# Patient Record
Sex: Female | Born: 1935 | Race: White | Hispanic: No
Health system: Southern US, Community
[De-identification: ages and names within clinical notes are randomized; demographics above are authoritative.]

## PROBLEM LIST (undated history)

## (undated) DIAGNOSIS — F0390 Unspecified dementia without behavioral disturbance: Secondary | ICD-10-CM

## (undated) DIAGNOSIS — I679 Cerebrovascular disease, unspecified: Secondary | ICD-10-CM

## (undated) DIAGNOSIS — Z8679 Personal history of other diseases of the circulatory system: Secondary | ICD-10-CM

## (undated) DIAGNOSIS — I10 Essential (primary) hypertension: Secondary | ICD-10-CM

## (undated) DIAGNOSIS — J449 Chronic obstructive pulmonary disease, unspecified: Secondary | ICD-10-CM

## (undated) DIAGNOSIS — E119 Type 2 diabetes mellitus without complications: Secondary | ICD-10-CM

## (undated) DIAGNOSIS — Z9889 Other specified postprocedural states: Secondary | ICD-10-CM

## (undated) HISTORY — DX: Type 2 diabetes mellitus without complications: E11.9

## (undated) HISTORY — DX: Unspecified dementia, unspecified severity, without behavioral disturbance, psychotic disturbance, mood disturbance, and anxiety: F03.90

## (undated) HISTORY — PX: THYROIDECTOMY: SHX17

## (undated) HISTORY — DX: Cerebrovascular disease, unspecified: I67.9

## (undated) HISTORY — DX: Chronic obstructive pulmonary disease, unspecified: J44.9

## (undated) HISTORY — PX: MASTECTOMY: SHX3

## (undated) HISTORY — DX: Other specified postprocedural states: Z98.890

## (undated) HISTORY — DX: Personal history of other diseases of the circulatory system: Z86.79

## (undated) HISTORY — DX: Essential (primary) hypertension: I10

---

## 2007-07-09 ENCOUNTER — Inpatient Hospital Stay (HOSPITAL_COMMUNITY): Admission: EM | Admit: 2007-07-09 | Discharge: 2007-07-15 | Payer: Self-pay | Admitting: Emergency Medicine

## 2007-07-09 ENCOUNTER — Ambulatory Visit: Payer: Self-pay | Admitting: Cardiovascular Disease

## 2007-08-15 ENCOUNTER — Emergency Department (HOSPITAL_COMMUNITY): Admission: EM | Admit: 2007-08-15 | Discharge: 2007-08-15 | Payer: Self-pay | Admitting: Emergency Medicine

## 2008-01-14 IMAGING — CR DG ABD PORTABLE 1V
1 series · 1 of 1 positions shown · non-contrast
Comparison: None

CLINICAL DATA: Chest pain

PORTABLE ABDOMEN - 1 VIEW

[view not recorded]
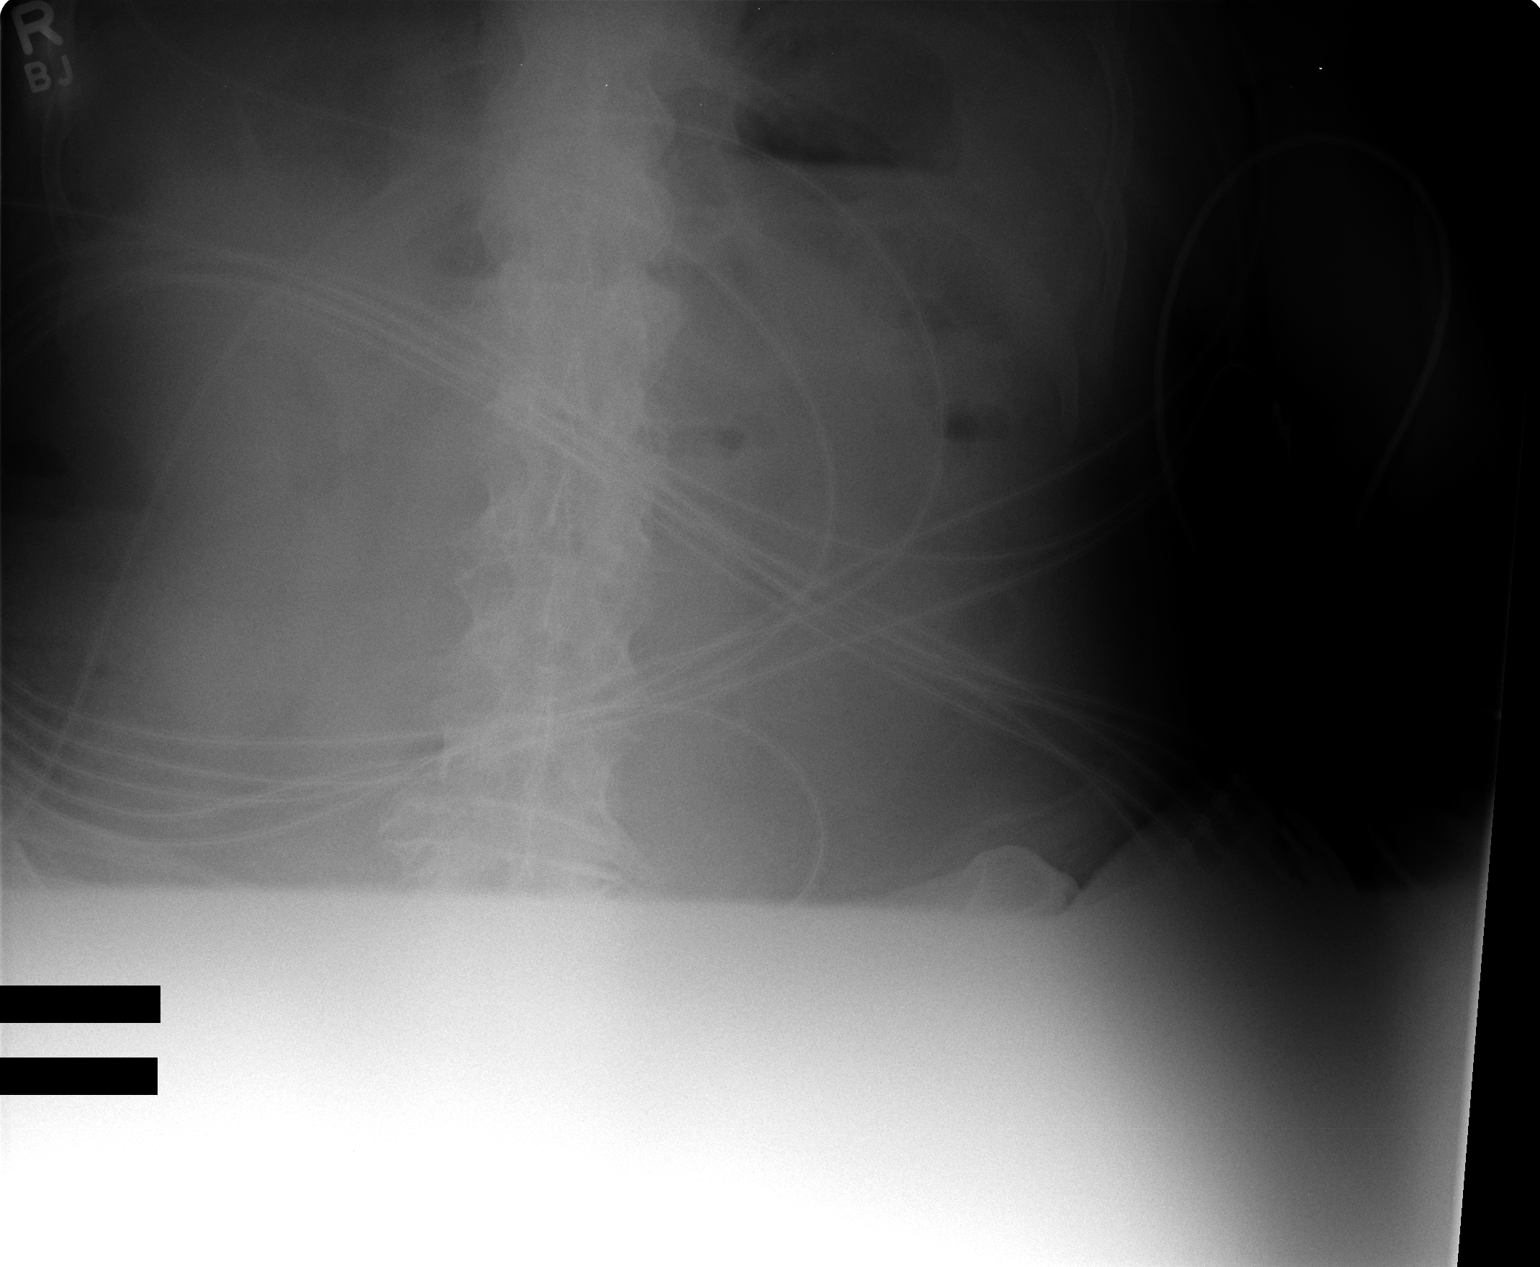

[1 of 1 positions shown; findings below may reference images not displayed]

FINDINGS: Study is severely limited by patient's condition and portable nature
the study. No dilated bowel to suggest obstruction. No supine evidence of free
air.

IMPRESSION

Severely limited study. No obstruction.

## 2008-01-18 ENCOUNTER — Emergency Department (HOSPITAL_COMMUNITY): Admission: EM | Admit: 2008-01-18 | Discharge: 2008-01-18 | Payer: Self-pay | Admitting: Emergency Medicine

## 2008-09-22 ENCOUNTER — Ambulatory Visit (HOSPITAL_COMMUNITY): Admission: RE | Admit: 2008-09-22 | Discharge: 2008-09-22 | Payer: Self-pay | Admitting: Internal Medicine

## 2009-10-03 ENCOUNTER — Ambulatory Visit (HOSPITAL_COMMUNITY): Admission: RE | Admit: 2009-10-03 | Discharge: 2009-10-03 | Payer: Self-pay | Admitting: Internal Medicine

## 2010-03-22 DIAGNOSIS — F068 Other specified mental disorders due to known physiological condition: Secondary | ICD-10-CM | POA: Insufficient documentation

## 2010-03-24 ENCOUNTER — Ambulatory Visit: Payer: Self-pay | Admitting: Cardiology

## 2010-03-24 DIAGNOSIS — I495 Sick sinus syndrome: Secondary | ICD-10-CM

## 2010-03-24 DIAGNOSIS — I10 Essential (primary) hypertension: Secondary | ICD-10-CM

## 2010-03-27 ENCOUNTER — Encounter: Payer: Self-pay | Admitting: Cardiology

## 2010-09-28 ENCOUNTER — Ambulatory Visit: Payer: Self-pay | Admitting: Cardiology

## 2010-09-29 ENCOUNTER — Encounter: Payer: Self-pay | Admitting: Cardiology

## 2010-10-09 ENCOUNTER — Ambulatory Visit (HOSPITAL_COMMUNITY): Admission: RE | Admit: 2010-10-09 | Payer: Self-pay | Admitting: Internal Medicine

## 2010-12-09 ENCOUNTER — Encounter: Payer: Self-pay | Admitting: Internal Medicine

## 2010-12-19 NOTE — Assessment & Plan Note (Signed)
Summary: **PER NP FOR [facility]/TAMMY LEWIS FOR BRADYCARDIA/TG   Visit Type:  Initial Consult Primary Provider:  Memory Dance, NP   History of Present Illness: 77 year old woman referred for cardiology consultation. She is a pleasant resident of [facility] nursing home here in [place] with past medical history outlined below. She would appear to have fairly significant dementia based on her history providing. She has a DNR status.  Mr. Ruder is referred with findings of recent bradycardia, that would appear to be fairly asymptomatic. Recent records indicate evaluation in April with findings of heart rates in the 40s to 50s, and a report of an electrocardiogram suggesting atrial fibrillation with nonspecific ST-T changes and possible old anterior infarct pattern. I do not have the actual tracing for review. Recent TSH in April was normal at 2.9. Labs from February revealed potassium 4.5, BUN 24, creatinine 1.0.  The patient states that she ambulates some and also uses a wheelchair. She denies any significant exertional chest pain or unusual breathlessness. She also denies dizziness and frank syncope.  I reviewed her medications which are outlined below.  Followup electrocardiogram is described below.  Current Medications (verified): 1)  Vit D .... 50,000 Units Mthly 2)  Allopurinol 100 Mg Tabs (Allopurinol) .... Take 1 Tab Daily 3)  Aspir-Low 81 Mg Tbec (Aspirin) .... Take 1 Tab Daily 4)  Certavite/antioxidants  Tabs (Multiple Vitamins-Minerals) .... Take 1 Tab Daily 5)  Aricept 10 Mg Tabs (Donepezil Hcl) .... Take 1 Tab Daily 6)  Furosemide 20 Mg Tabs (Furosemide) .... Take 1 Tab Daily 7)  Potassium Chloride 20 Meq Pack (Potassium Chloride) .... Take 1 Tab Daily 8)  Lisinopril 40 Mg Tabs (Lisinopril) .... Take 1 Tab Daily 9)  Oxybutynin Chloride 10 Mg Xr24h-Tab (Oxybutynin Chloride) .... Take 1 Tab Daily 10)  Spiriva Handihaler 18 Mcg Caps (Tiotropium Bromide Monohydrate) .... Use 1  Treatment Two Times A Day 11)  Daily Multi  Tabs (Multiple Vitamins-Minerals) .... Take 1 Tab Daily 12)  Clonidine Hcl 0.1 Mg Tabs (Clonidine Hcl) .... Take 1 Tab Two Times A Day 13)  Ranitidine Hcl 150 Mg Caps (Ranitidine Hcl) .... Take 1 Tab Two Times A Day 14)  Oscal 500/200 D-3 500-200 Mg-Unit Tabs (Calcium-Vitamin D) .... Take 1 Tab Daily 15)  Simvastatin 20 Mg Tabs (Simvastatin) .... Take 1 Tab At Bedtime 16)  Imodium A-D 2 Mg Tabs (Loperamide Hcl) .... Take As Needed 17)  Nitrostat 0.4 Mg Subl (Nitroglycerin) .... Use As Directed For Chest Pain 18)  Senokot S 8.6-50 Mg Tabs (Sennosides-Docusate Sodium) .... Take As Needed 19)  Benadryl 25 Mg Caps (Diphenhydramine Hcl) .... Take As Needed 20)  Q-Tussin 100 Mg/67ml Syrp (Guaifenesin) .... Take As Needed 21)  Lortab 5-500 Mg Tabs (Hydrocodone-Acetaminophen) .... Take As Needed For Pain 22)  Tylenol 325 Mg Tabs (Acetaminophen) .... Take As Needed 23)  Lantus 100 Unit/ml Soln (Insulin Glargine) .... Take 20 Units Daily 24)  Novolin N 100 Unit/ml Susp (Insulin Isophane Human) .... Sliding Scale  Allergies (verified): No Known Drug Allergies  Past History:  Past Medical History: Last updated: 03/22/2010 Type A aortic dissection - Dx at [facility] 2008, managed conservatively Cerebrovascular Disease - TIA C O P D Diabetes Type 2 Hypertension Dementia  Past Surgical History: Last updated: 03/22/2010 Mastectomy Thyroidectomy  Family History: Last updated: 03/22/2010 Noncontributory  Social History: Last updated: 03/22/2010 Nursing home resident Widowed  Tobacco Use - No Alcohol Use - no DNR status  Clinical Review Panels:  Echocardiogram Echocardiogram  FINDINGS:  Left ventricular cavity size was normal.  There was mild LVH.   Ejection fraction was 60%.  There were no regional wall motion   abnormalities.   Mitral valve was mildly thickened with mild MR.   The aortic valve was sclerotic.  It was somewhat poorly-visualized.   The   aortic root appeared moderately dilated.  There did appear to be a   dissection plane seen inferiorly on parasternal views, however this was   not well-delineated and there was clearly not a flap or serpentine   intimal tissue layer seen.  There did not appear to be any significant   aortic stenosis or aortic insufficiency.   There was mild left atrial enlargement.   Right-sided heart chambers were normal.   There was mild TR with no evidence of pulmonary hypertension.   Subcostal imaging revealed no pleural effusion, no source of embolus, no   ASD.      FINAL IMPRESSION:   1. Mild left ventricular hypertrophy, ejection fraction 60%.   2. Mild mitral regurgitation.   3. Aortic valve sclerosis with mild to moderate aortic root       dilatation, possible dissection flap seen inferiorly on parasternal       views.  No aortic insufficiency.   4. Normal right ventricle.   5. Mild left atrial enlargement.   6. No pericardial effusion. (07/09/2007)    Review of Systems  The patient denies anorexia, fever, chest pain, syncope, dyspnea on exertion, peripheral edema, prolonged cough, abdominal pain, melena, hematochezia, and severe indigestion/heartburn.         Otherwise reviewed and negative.  Vital Signs:  Patient profile:   77 year old female Height:      65 inches Weight:      196 pounds BMI:     32.73 Pulse rate:   48 / minute BP sitting:   143 / 62  (right arm)  Vitals Entered By: Dreama Saa, CNA (Mar 24, 2010 2:21 PM)  Physical Exam  Additional Exam:  Obese elderly woman seated in a wheelchair in no acute distress. HEENT: Conjunctiva and lids normal, oropharynx with moist mucosa. Neck: Supple, no elevated JVP or bruits. Lungs: Diminished breath sounds, non-labored. Cardiac: Indistinct PMI, regular rate and rhythm, no S3, soft systolic murmur. No diastolic murmur. Abdomen: Obese, nontender, bowel sounds present. Extremities: Venous stasis, trace ankle edema,  distal pulses 1-2+. Skin: Warm and dry. Musculoskeletal: No gross deformities. Neuropsychiatric: Patient alert and oriented x1, affect appropriate.   EKG  Procedure date:  03/24/2010  Findings:      Junctional bradycardia at 51 beats per minutes with leftward axis, R prime in lead V1, nonspecific T-wave flattening.  Impression & Recommendations:  Problem # 1:  SICK SINUS SYNDROME (ICD-427.81)  Junctional bradycardia, essentially asymptomatic based on available information and patient history, with probable underlying conduction system disease and sick sinus syndrome. She has not had any obvious dizziness or syncope, and at this point there is no clear indication for pacemaker. In reviewing her medications, two stand out as potentially contributing. She is on clonidine, which can sometimes affect heart rate. Aricept is also known to cause bradycardia, particularly in patient's with conduction system disease. Would suggest perhaps discontinuing Aricept to see if this leads to a positive effect on heart rate. Otherwise clonidine could be discontinued in favor of a different antihypertensive with no clear affect on AV nodal conduction. Follow up visit will be scheduled for 6 months.  The following medications were removed  from the medication list:    Lisinopril 40 Mg Tabs (Lisinopril) .Marland Kitchen... Take 1 tab daily    Nitrostat 0.4 Mg Subl (Nitroglycerin) Her updated medication list for this problem includes:    Aspir-low 81 Mg Tbec (Aspirin) .Marland Kitchen... Take 1 tab daily    Lisinopril 40 Mg Tabs (Lisinopril) .Marland Kitchen... Take 1 tab daily    Nitrostat 0.4 Mg Subl (Nitroglycerin) ..... Use as directed for chest pain  Problem # 2:  ESSENTIAL HYPERTENSION, BENIGN (ICD-401.1)  As noted above, could alternatively consider discontinuation of clonidine, and perhaps switching to a different medication such as Norvasc if needed. She is also on lisinopril and Lasix.  The following medications were removed from the  medication list:    Lisinopril 40 Mg Tabs (Lisinopril) .Marland Kitchen... Take 1 tab daily    Clonidine Hcl 0.1 Mg Tabs (Clonidine hcl) .Marland Kitchen... Take 1 tab two times a day Her updated medication list for this problem includes:    Aspir-low 81 Mg Tbec (Aspirin) .Marland Kitchen... Take 1 tab daily    Furosemide 20 Mg Tabs (Furosemide) .Marland Kitchen... Take 1 tab daily    Lisinopril 40 Mg Tabs (Lisinopril) .Marland Kitchen... Take 1 tab daily    Clonidine Hcl 0.1 Mg Tabs (Clonidine hcl) .Marland Kitchen... Take 1 tab two times a day  Patient Instructions: 1)  Your physician recommends that you schedule a follow-up appointment in: 6 months 2)  Your physician recommends that you continue on your current medications as directed. Please refer to the Current Medication list given to you today.

## 2010-12-19 NOTE — Letter (Signed)
Summary: LABS  LABS   Imported By: Faythe Ghee 03/27/2010 12:19:16  _____________________________________________________________________  External Attachment:    Type:   Image     Comment:   External Document

## 2010-12-19 NOTE — Letter (Signed)
Summary: PROGRESS NOTE/ [facility]  PROGRESS NOTE/ [facility]   Imported By: Faythe Ghee 03/27/2010 12:21:38  _____________________________________________________________________  External Attachment:    Type:   Image     Comment:   External Document

## 2010-12-19 NOTE — Letter (Signed)
Summary: EKG [facility]  EKG [facility]   Imported By: Faythe Ghee 03/27/2010 12:22:04  _____________________________________________________________________  External Attachment:    Type:   Image     Comment:   External Document

## 2010-12-19 NOTE — Assessment & Plan Note (Signed)
Summary: 6 mth f/u per checkout on 03/24/10/tgt   Visit Type:  Follow-up Referring Provider:  Memory Dance, NP Primary Provider:  Dr. Avon Gully   History of Present Illness: 77 year old woman presents for followup. She reports no problems with chest pain, dizziness, or syncope. Indicates stable appetite.  ECG is reviewed below showing junctional rhythm, although with heart rate up to 60. I note that she is no longer on clonidine.  Clinical Review Panels:  Echocardiogram Echocardiogram  FINDINGS:  Left ventricular cavity size was normal.  There was mild LVH.   Ejection fraction was 60%.  There were no regional wall motion   abnormalities.   Mitral valve was mildly thickened with mild MR.   The aortic valve was sclerotic.  It was somewhat poorly-visualized.  The   aortic root appeared moderately dilated.  There did appear to be a   dissection plane seen inferiorly on parasternal views, however this was   not well-delineated and there was clearly not a flap or serpentine   intimal tissue layer seen.  There did not appear to be any significant   aortic stenosis or aortic insufficiency.   There was mild left atrial enlargement.   Right-sided heart chambers were normal.   There was mild TR with no evidence of pulmonary hypertension.   Subcostal imaging revealed no pleural effusion, no source of embolus, no   ASD.      FINAL IMPRESSION:   1. Mild left ventricular hypertrophy, ejection fraction 60%.   2. Mild mitral regurgitation.   3. Aortic valve sclerosis with mild to moderate aortic root       dilatation, possible dissection flap seen inferiorly on parasternal       views.  No aortic insufficiency.   4. Normal right ventricle.   5. Mild left atrial enlargement.   6. No pericardial effusion. (07/09/2007)    Current Medications (verified): 1)  Vit D .... 50,000 Units Mthly 2)  Allopurinol 100 Mg Tabs (Allopurinol) .... Take 1 Tab Daily 3)  Aspir-Low 81 Mg Tbec (Aspirin)  .... Take 1 Tab Daily 4)  Certavite/antioxidants  Tabs (Multiple Vitamins-Minerals) .... Take 1 Tab Daily 5)  Aricept 10 Mg Tabs (Donepezil Hcl) .... Take 1 Tab Daily 6)  Furosemide 20 Mg Tabs (Furosemide) .... Take 1 Tab Daily 7)  Klor-Con 10 10 Meq Cr-Tabs (Potassium Chloride) .... Take 1 Tab Daily 8)  Lisinopril 40 Mg Tabs (Lisinopril) .... Take 1 Tab Daily 9)  Oxybutynin Chloride 10 Mg Xr24h-Tab (Oxybutynin Chloride) .... Take 1 Tab Daily 10)  Spiriva Handihaler 18 Mcg Caps (Tiotropium Bromide Monohydrate) .... Use 1 Treatment Two Times A Day 11)  Daily Multi  Tabs (Multiple Vitamins-Minerals) .... Take 1 Tab Daily 12)  Oscal 500/200 D-3 500-200 Mg-Unit Tabs (Calcium-Vitamin D) .... Take 1 Tab Daily 13)  Simvastatin 20 Mg Tabs (Simvastatin) .... Take 1 Tab At Bedtime 14)  Imodium A-D 2 Mg Tabs (Loperamide Hcl) .... Take As Needed 15)  Nitrostat 0.4 Mg Subl (Nitroglycerin) .... Use As Directed For Chest Pain 16)  Senokot S 8.6-50 Mg Tabs (Sennosides-Docusate Sodium) .... Take As Needed 17)  Benadryl 25 Mg Caps (Diphenhydramine Hcl) .... Take As Needed 18)  Q-Tussin 100 Mg/63ml Syrp (Guaifenesin) .... Take As Needed 19)  Lortab 5-500 Mg Tabs (Hydrocodone-Acetaminophen) .... Take As Needed For Pain 20)  Tylenol 325 Mg Tabs (Acetaminophen) .... Take As Needed 21)  Lantus 100 Unit/ml Soln (Insulin Glargine) .... Take 26 Units Daily 22)  Novolin N 100 Unit/ml  Susp (Insulin Isophane Human) .... Sliding Scale  Allergies: No Known Drug Allergies  Comments:  Nurse/Medical Assistant: med list brought from [facility]   Past History:  Past Medical History: Last updated: 03/22/2010 Type A aortic dissection - Dx at [facility] 2008, managed conservatively Cerebrovascular Disease - TIA [facility] O P D Diabetes Type 2 Hypertension Dementia  Social History: Last updated: 03/22/2010 Nursing home resident Widowed  Tobacco Use - No Alcohol Use - no DNR status  Review of Systems  The patient denies  anorexia, fever, chest pain, syncope, dyspnea on exertion, and severe indigestion/heartburn.         Otherwise reviewed and negative.  Vital Signs:  Patient profile:   77 year old female Weight:      195 pounds BMI:     32.57 Pulse rate:   60 / minute BP sitting:   116 / 53  (right arm)  Vitals Entered By: Dreama Saa, CNA (September 28, 2010 2:12 PM)  Physical Exam  Additional Exam:  Obese elderly woman seated in a wheelchair in no acute distress. HEENT: Conjunctiva and lids normal, oropharynx with moist mucosa. Neck: Supple, no elevated JVP or bruits. Lungs: Diminished breath sounds, non-labored. Cardiac: Indistinct PMI, regular rate and rhythm, no S3, soft systolic murmur. No diastolic murmur. Abdomen: Obese, nontender, bowel sounds present. Extremities: Venous stasis, trace ankle edema, distal pulses 1-2+.   EKG  Procedure date:  09/28/2010  Findings:      Junctional rhythm at 60 beats per minutes, small R prime in lead V1.  Impression & Recommendations:  Problem # 1:  SICK SINUS SYNDROME (ICD-427.81)  Asymptomatic. Junctional rhythm persists, although heart rate has increased to 60. She is now off clonidine. At this point would not anticipate any further cardiac workup. As noted previously, Aricept can also lead to bradycardia. This medicine could always be discontinued as the next step if needed. Further followup with Dr. Ninetta Lights. We can see her back as needed.  Her updated medication list for this problem includes:    Aspir-low 81 Mg Tbec (Aspirin) .Marland Kitchen... Take 1 tab daily    Lisinopril 40 Mg Tabs (Lisinopril) .Marland Kitchen... Take 1 tab daily    Nitrostat 0.4 Mg Subl (Nitroglycerin) ..... Use as directed for chest pain  Patient Instructions: 1)  Your physician recommends that you schedule a follow-up appointment in: as needed  2)  Your physician recommends that you continue on your current medications as directed. Please refer to the Current Medication list given to you  today.

## 2011-04-03 NOTE — Procedures (Signed)
NAME:  April Freeman, April Freeman           ACCOUNT NO.:  [id number]   MEDICAL RECORD NO.:  [id number]          PATIENT TYPE:  INP   LOCATION:  IC09                          FACILITY:  [facility]   PHYSICIAN:  Peter C. Eden Emms, MD, FACCDATE OF BIRTH:  Jun 01, 1925   DATE OF PROCEDURE:  07/09/2007  DATE OF DISCHARGE:                                ECHOCARDIOGRAM   INDICATION:  Aortic dissection.   FINDINGS:  Left ventricular cavity size was normal.  There was mild LVH.  Ejection fraction was 60%.  There were no regional wall motion  abnormalities.  Mitral valve was mildly thickened with mild MR.  The aortic valve was sclerotic.  It was somewhat poorly-visualized.  The  aortic root appeared moderately dilated.  There did appear to be a  dissection plane seen inferiorly on parasternal views, however this was  not well-delineated and there was clearly not a flap or serpentine  intimal tissue layer seen.  There did not appear to be any significant  aortic stenosis or aortic insufficiency.  There was mild left atrial enlargement.  Right-sided heart chambers were normal.  There was mild TR with no evidence of pulmonary hypertension.  Subcostal imaging revealed no pleural effusion, no source of embolus, no  ASD.   FINAL IMPRESSION:  1. Mild left ventricular hypertrophy, ejection fraction 60%.  2. Mild mitral regurgitation.  3. Aortic valve sclerosis with mild to moderate aortic root      dilatation, possible dissection flap seen inferiorly on parasternal      views.  No aortic insufficiency.  4. Normal right ventricle.  5. Mild left atrial enlargement.  6. No pericardial effusion.      Noralyn Pick. Eden Emms, MD, [facility]  Electronically Signed     PCN/MEDQ  D:  07/09/2007  T:  07/10/2007  Job:  [id number]

## 2011-04-03 NOTE — Consult Note (Signed)
NAME:  April Freeman, April Freeman           ACCOUNT NO.:  [id number]   MEDICAL RECORD NO.:  [id number]          PATIENT TYPE:  INP   LOCATION:  IC09                          FACILITY:  [facility]   PHYSICIAN:  Peter C. Eden Emms, MD, FACCDATE OF BIRTH:  07/02/1925   DATE OF CONSULTATION:  07/09/2007  DATE OF DISCHARGE:                                 CONSULTATION   REASON FOR CONSULTATION:  [name] is an 77 year old patient  admitted by Dr. Ninetta Lights D. Fanta last night.  We were asked to help  evaluate her in regards to hypertension, atrial fibrillation flutter and  recent diagnosis of aortic dissection.   HISTORY OF PRESENT ILLNESS:  The patient came to the emergency room with  severe 10/10 chest pain.  My understanding is that she was just  discharged a couple of days ago from [facility] with the diagnosis  of type A ascending aortic dissection.  Apparently because of  comorbidities she was not thought to be a candidate for surgery.  Medical therapy with tight blood pressure control was indicated.  The  patient went back to the nursing home.  She was seen early in the  morning for substernal chest pain.  The pain was persistent.  It was a  10/10 and radiated to the left axilla.  It did have a sharp component to  it.  The patient did have aggravation of the pain by deep breathing.  She had a chest x-ray at [facility], which interestingly did  not comment on an aortic dissection.  There was no pneumonia.  The  patient subsequently was treated for her pain and admitted to the  hospital.   The patient has significant dementia.  Apparently she has also been made  a DNR.  I spoke with Dr. Ninetta Lights D. Fanta regarding the situation.  The  patient clearly has not died from her type A dissection, despite  comorbidities.  If she is stable days after the initial event and having  recurrent chest pain, in my mind it would be worth while to send her  back to [facility] for a second  opinion.   The patient does have significant dementia and it is difficult to get a  history from her.  Currently she does not complain of any palpitations.  Her chest pain is improved, but she still says she has it.  There is no  shortness of breath, no PND, no orthopnea.  The patient was actually  able to sleep last night.   Her blood pressure has been very labile in the intensive care unit.  Initially in the morning it was over 200 systolic.  Currently it is only  120.  She has been getting nitroglycerin and no Nipride.   REVIEW OF SYSTEMS:  Is otherwise negative.   PAST MEDICAL/SURGICAL HISTORY:  1. This is sketchy, as she has not really been seen here before.      There is a history of hypertension.  2. History of chronic obstructive pulmonary disease.  3. Previous thyroidectomy.  4. Previous mastectomy.  5. Question history of TIA.  6. Diabetes.  CURRENT MEDICATIONS:  1. She was supposed to be on Lopressor 12.5 mg twice daily.  2. Capoten 25 mg three times daily.  3. Clonidine 0.1 mg q.4h.  4. Accu-Cheks.   FAMILY HISTORY:  Noncontributory.   ALLERGIES:  No known drug allergies.   SOCIAL HISTORY:  The patient is currently a resident of a nursing home  where she was sent from [facility].  She does not drink or smoke.  I believe there is a sister involved with her care.  There is a history  of dementia.  As mentioned previously, she has been made a DNR.   PHYSICAL EXAMINATION:  GENERAL:  A somewhat pale, elderly white female,  in no distress.  VITAL SIGNS:  Current blood pressure 120/70.  She is in atrial flutter  at a rate of 60.  Respiratory rate 14.  She is afebrile.  I did not take  blood pressures in both arms, but clearly she has much cooler left upper  extremity than right, with a decreased radial pulse in the left.  HEENT:  Normal.  NECK:  I could not hear any carotid bruits.  JVP is not elevated.  There  is no thyromegaly, no lymphadenopathy.  LUNGS:   Currently clear with good diaphragmatic motion.  No wheezing.  HEART:  There is an S1 and S2, with a soft systolic murmur.  I do not  hear any aortic insufficiency.  PMI is not palpable.  ABDOMEN:  Bowel sounds positive.  No abdominal aortic aneurysm.  No  hepatosplenomegaly.  No hepatojugular reflux, no tenderness.  She has  multiple seborrheic keratoses over her abdomen.  BREASTS:  She is status post mastectomy.  EXTREMITIES:  Femorals are +2 bilaterally without bruits.  Distal pulses  intact.  No edema.  GENITOURINARY:  She has a Foley catheter in place.   Her electrocardiogram shows no acute changes.  She has atrial flutter  with a fairly slow rate.  There is no evidence of previous myocardial  infarction and voltage criteria for left ventricular dysfunction.   Her chest x-ray shows cardiomegaly.  There is no large effusion.  Interestingly there is no comment about her mediastinum or evidence of  aortic dissection.   LABORATORY DATA:  Initially remarkable for a CPK of 339 with a relative  index of 3.3, troponin 0.05.  Potassium 4.8, BUN 15, creatinine 0.8.  Hematocrit 37.3, platelet count 325, white count 9.8.   IMPRESSION/PLAN:  1. Hypertension, currently well-controlled on nitroglycerin alone:  In      regards to intravenous medications, this appears to be quite      labile.  I would continue her on her oral medications.  If the p.o.      intake gets to be an issue, she can have her clonidine switched to      a Catapres TTS patch.  If her blood pressure were to be difficult      to control, I would add Nipride to her nitroglycerin.  Currently it      is very well controlled.  2. Atrial fibrillation flutter:  If anything, her rates have been a      little bit slow.  Again, the negative pure stress from a slow heart      rate is a good thing, in the setting of a dissection.  I would      continue her oral metoprolol at its current dose of 12.5 mg twice      daily.  She is  clearly not an anticoagulation candidate.  In fact,      it appears that she has been placed on deep venous thrombosis      prophylaxis Lovenox, in the setting of an aortic dissection.  This      is contraindicated, and I have taken the liberty of stopping this.  3. Aortic dissection:  I understand that the patient is 77 years old      and that she has dementia; however, she clearly has not had the      typical course for an acute aortic dissection.  In my mind, since      she is well known at [facility], I would transfer the patient      there for a second opinion or a re-look in regards to therapy.  It      is also somewhat curious to me that her chest x-ray did not comment      on any significant mediastinal widening.  Clearly she has not had      aortic insufficiency or compromise of her kidney function.  I would      be a little bit suspicious that the dissection may have extended to      the left subclavian, since her pulse appears to be weaker there.      We will take the liberty of repeating a chest x-ray and doing a 2-D      echocardiogram, to see if we can see the proximal extent of the      dissection above the aortic valve; however, I think the patient      would be best served by a transfer to [facility], and I      relayed this message to Dr. Felecia Shelling.  4. Elevated CPK:  This actually may be more from aortic medial or      adventitial tissue.  I do not think it represents a cardiac      problem.  If it does represent infarction, it would be from the      right coronary ostia and be secondary to her dissection.  There is      no previously-documented history of coronary artery disease.  An      echocardiogram will help [place] in regards to left ventricular      function, which I suspect is normal with left ventricular      dysfunction.   Again, the patient is an elderly DNR with dementia, and I am not sure  just how aggressive we should be here.  If the patient is to be  treated  more aggressively, I would transfer her back to [facility], since  they were aware of her original diagnosis and care.      Noralyn Pick. Eden Emms, MD, [facility]  Electronically Signed     PCN/MEDQ  D:  07/09/2007  T:  07/09/2007  Job:  [id number]

## 2011-04-03 NOTE — H&P (Signed)
NAME:  April Freeman, April Freeman           ACCOUNT NO.:  [id number]   MEDICAL RECORD NO.:  [id number]          PATIENT TYPE:  INP   LOCATION:  IC09                          FACILITY:  [facility]   PHYSICIAN:  Tesfaye D. Felecia Shelling, MD   DATE OF BIRTH:  1925/10/13   DATE OF ADMISSION:  07/09/2007  DATE OF DISCHARGE:  LH                              HISTORY & PHYSICAL   CHIEF COMPLAINT:  Chest pain.   HISTORY OF PRESENT ILLNESS:  This is an 77 year old female patient with  history of multiple medical illnesses who is a resident of [facility] who was brought to the emergency room with above complaint.  The patient was recently discharged and was admitted to the nursing home  about three days prior.  She was treated for subacute type A aortic  dissection at [facility].  The patient was found to be not a  surgical candidate and was advised to continue medical treatment with  tighter blood pressure control.  She has been in nursing home taking  therapy according to the __________.  However, the patient was developed  chest pain  and she was brought to the emergency room where she was  evaluated.  The patient was started on nitroglycerin.  Her pain was  controlled.  She currently on IV therapy and __________ and long term  prognosis.  The patient does not want to be resuscitated or put on  ventilatory support at this time.  The patient __________ Her chest pain  has been relieved since she has been in ICU.   REVIEW OF SYSTEMS:  No fevers, chills, cough. No nausea and vomiting.  __________   PAST MEDICAL HISTORY:  1. Subacute type A aortic dissection__________ .  2. __________  3. __________  4. Chronic obstructive pulmonary disease.  5. Status post thyroidectomy.  6. __________ status post mastectomy.  7. History of TIA.  8. Diabetes mellitus.   DISCHARGE MEDICATIONS:  1. Metoprolol 12.5 mg p.o. b.i.d.  2. Capoten 25 mg p.o. t.i.d.  3. Nitroglycerin 0.4 p.o. q.5 minutes p.r.n.  4. Clonidine 0.1 mg p.o. q.4h. as needed.  5. Accu-Chek with sliding scale insulin coverage.   SOCIAL HISTORY:  The patient is a resident of a nursing home.  No  history of alcohol, tobacco or substance abuse.   PHYSICAL EXAMINATION:  GENERAL APPEARANCE:  The patient is alert, awake  and chronically sick-looking.  VITAL SIGNS:  On admission blood pressure 184/80, pulse 60, respiratory  rate 22, temperature 98.58F.  HEENT:  Pupils are equally reactive.  NECK:  Supple.  CHEST:  Decreased air entry, a few rhonchi at the bases.  CARDIOVASCULAR:  Normal first and second heart sounds heard.  No  murmurs.  No gallops.  ABDOMEN: Soft, lax.  Bowel sounds present.  No masses or organomegaly.  EXTREMITIES:  No leg edema.   LABORATORY DATA:  On admission urinalysis shows specific gravity 1.030.  PH 5.0, WBC negative, nitrite negative.  Cardiac enzymes CPK 109, CK-MB  0.4, troponin less than 0.05.  BMP with sodium 136, potassium 4.8,  chloride 97, carbon dioxide 30 with blood glucose  212.  BUN 15,  creatinine 0.7.  Calcium 8.7.  CBC with WBC 9.8, hemoglobin 12.7,  hematocrit 37.3, platelet count 325,000.   ASSESSMENT:  1. Recurrent chest pain secondary to subacute type A aortic      dissection.  2. Hypertension, poorly controlled.  3. Diabetes mellitus.  4. History of dementia.  5. Chronic obstructive pulmonary disease.  6. History of transient ischemic attack.   PLAN:  Will continue the patient on nitroglycerin drip.  I will obtain a  cardiology consult.  Will continue on antihypertensive and try to  control her blood pressure.  Will continue her regular medications.  As  mentioned earlier, I will discuss her condition with her and her sister,  who is her Power of [address].  They have agreed for a do not resuscitate  status.      Tesfaye D. Felecia Shelling, MD  Electronically Signed     TDF/MEDQ  D:  07/09/2007  T:  07/09/2007  Job:  [id number]

## 2011-04-03 NOTE — Discharge Summary (Signed)
NAME:  April Freeman, April Freeman           ACCOUNT NO.:  [id number]   MEDICAL RECORD NO.:  [id number]          PATIENT TYPE:  INP   LOCATION:  A217                          FACILITY:  [facility]   PHYSICIAN:  Tesfaye D. Felecia Shelling, MD   DATE OF BIRTH:  1925-11-01   DATE OF ADMISSION:  07/09/2007  DATE OF DISCHARGE:  08/26/2008LH                               DISCHARGE SUMMARY   DISCHARGE DIAGNOSES:  1. Subacute type A aortic dissection.  2. Chest pain, probably secondary to above.  3. Hypertension.  4. Diabetes mellitus.  5. Dementia.  6. Chronic obstructive pulmonary disease.  7. History of transient ischemic attack.   DISCHARGE MEDICATIONS:  1. Accu-Chek with sliding scale coverage.  2. Multivitamin one tablet daily.  3. Lisinopril 40 mg p.o. daily.  4. Clonidine 0.1 mg b.i.d.  5. Nitroglycerin 0.4 mg sublingual p.r.n. 5 minutes for chest pain.   DISPOSITION:  The patient will be transferred back to [facility].   HOSPITAL COURSE:  This is an 77 year old, female patient with history of  multiple medical illnesses who was admitted to [facility]  recently from [facility].  She was admitted to [facility]  due to aortic dissection.  The patient was evaluated by thoracic surgeon  and was told that she was not a surgical candidate.  The patient's blood  pressure was controlled and she was transferred to the nursing home.  While the patient was in the nursing home, she developed chest pain and  was brought to the emergency room.  The patient was admitted to ICU on  nitroglycerin drip.  The patient was evaluated by cardiologist and her  medications were adjusted.  Discussion was made with her sister who was  her power of attorney for further evaluation and possible surgical  treatment.  Her sister, who is her power of attorney, declined any  surgical procedure due to her dementia and she has claimed that she was  told in [facility] if she ever had surgery, her  dementia would get  worse and the patient's prognosis would be poor.  At this time, the  patient is going to be discharged back to nursing home and continue her  current medical treatment.     Tesfaye D. Felecia Shelling, MD  Electronically Signed    TDF/MEDQ  D:  07/15/2007  T:  07/15/2007  Job:  [id number]

## 2011-08-30 LAB — DIFFERENTIAL
Basophils Absolute: 0.1
Basophils Relative: 1
Eosinophils Absolute: 0.1
Eosinophils Relative: 1
Lymphocytes Relative: 23

## 2011-08-30 LAB — POCT CARDIAC MARKERS
Operator id: 215651
Troponin i, poc: <0.05

## 2011-08-30 LAB — BASIC METABOLIC PANEL
BUN: 9
GFR calc non Af Amer: >60
Glucose, Bld: 122 — ABNORMAL HIGH
Potassium: 3.7

## 2011-08-30 LAB — CBC
HCT: 32.3 — ABNORMAL LOW
MCHC: 33.3
Platelets: 213
RDW: 14.6 — ABNORMAL HIGH

## 2011-08-31 LAB — DIFFERENTIAL
Basophils Absolute: 0.1
Basophils Absolute: 0.1
Basophils Relative: 0
Basophils Relative: 1
Eosinophils Absolute: 0.1
Eosinophils Absolute: 0.2
Eosinophils Relative: 1
Eosinophils Relative: 1
Eosinophils Relative: 2
Lymphocytes Relative: 14
Lymphocytes Relative: 16
Lymphs Abs: 1.7
Monocytes Absolute: 0.5
Monocytes Absolute: 0.6
Monocytes Relative: 6
Neutro Abs: 6.1
Neutrophils Relative %: 71
Neutrophils Relative %: 79 — ABNORMAL HIGH

## 2011-08-31 LAB — URINALYSIS, ROUTINE W REFLEX MICROSCOPIC
Hgb urine dipstick: NEGATIVE
Protein, ur: NEGATIVE
Urobilinogen, UA: 1

## 2011-08-31 LAB — CARDIAC PANEL(CRET KIN+CKTOT+MB+TROPI)
CK, MB: 1.8
CK, MB: 11.1 — ABNORMAL HIGH
Relative Index: 3.3 — ABNORMAL HIGH
Relative Index: INVALID
Troponin I: 0.04
Troponin I: 0.05
Troponin I: 0.05

## 2011-08-31 LAB — BASIC METABOLIC PANEL
BUN: 12
BUN: 15
CO2: 28
CO2: 30
Calcium: 8.2 — ABNORMAL LOW
Chloride: 103
Chloride: 97
Creatinine, Ser: 0.73
Creatinine, Ser: 0.76
Creatinine, Ser: 0.81
GFR calc Af Amer: >60
GFR calc non Af Amer: >60
Glucose, Bld: 171 — ABNORMAL HIGH
Glucose, Bld: 212 — ABNORMAL HIGH
Potassium: 4.7
Sodium: 137

## 2011-08-31 LAB — CBC
HCT: 28.4 — ABNORMAL LOW
HCT: 34.6 — ABNORMAL LOW
HCT: 37.3
Hemoglobin: 11.8 — ABNORMAL LOW
MCHC: 33.8
MCHC: 34
MCHC: 34.2
MCV: 91.4
MCV: 91.5
Platelets: 267
Platelets: 325
RBC: 3.13 — ABNORMAL LOW
RDW: 13.4
RDW: 13.7
RDW: 13.8
WBC: 10.3
WBC: 15.1 — ABNORMAL HIGH

## 2011-08-31 LAB — POCT CARDIAC MARKERS
Operator id: 208461
Troponin i, poc: <0.05

## 2011-08-31 LAB — VITAMIN B12: Vitamin B-12: 174 — ABNORMAL LOW (ref 211–911)

## 2011-08-31 LAB — IRON AND TIBC
Saturation Ratios: 11 — ABNORMAL LOW
UIBC: 193

## 2011-08-31 LAB — FERRITIN: Ferritin: 1199 — ABNORMAL HIGH (ref 10–291)

## 2011-09-19 ENCOUNTER — Other Ambulatory Visit (HOSPITAL_COMMUNITY): Payer: Self-pay | Admitting: Internal Medicine

## 2011-09-21 ENCOUNTER — Ambulatory Visit (HOSPITAL_COMMUNITY)
Admission: RE | Admit: 2011-09-21 | Discharge: 2011-09-21 | Disposition: A | Discharge status: Home / Self Care | Payer: PRIVATE HEALTH INSURANCE | Source: Ambulatory Visit | Attending: Internal Medicine | Admitting: Internal Medicine

## 2011-09-21 DIAGNOSIS — R222 Localized swelling, mass and lump, trunk: Secondary | ICD-10-CM | POA: Insufficient documentation

## 2011-09-21 MED ORDER — IOHEXOL 300 MG/ML  SOLN: 80.0000 mL | Freq: Once | INTRAMUSCULAR | Status: AC | PRN

## 2011-11-08 ENCOUNTER — Encounter: Payer: Self-pay | Admitting: Cardiology

## 2013-10-05 ENCOUNTER — Other Ambulatory Visit: Payer: Self-pay | Admitting: *Deleted

## 2013-10-05 MED ORDER — FENTANYL 12 MCG/HR TD PT72: 12.5000 ug | MEDICATED_PATCH | TRANSDERMAL | Status: DC

## 2014-10-04 ENCOUNTER — Other Ambulatory Visit: Payer: Self-pay | Admitting: *Deleted

## 2014-10-04 MED ORDER — FENTANYL 12 MCG/HR TD PT72: MEDICATED_PATCH | TRANSDERMAL | Status: AC

## 2014-10-04 NOTE — Telephone Encounter (Signed)
[facility] 

## 2015-03-20 DEATH — deceased
